# Patient Record
Sex: Male | Born: 1970 | Race: White | Hispanic: No | Marital: Single | Smoking: Never smoker
Health system: Southern US, Community
[De-identification: ages and names within clinical notes are randomized; demographics above are authoritative.]

## PROBLEM LIST (undated history)

## (undated) HISTORY — PX: FOOT SURGERY: SHX648

---

## 2020-05-11 ENCOUNTER — Emergency Department (HOSPITAL_BASED_OUTPATIENT_CLINIC_OR_DEPARTMENT_OTHER)
Admission: EM | Admit: 2020-05-11 | Discharge: 2020-05-11 | Disposition: A | Discharge status: Home / Self Care | Payer: BLUE CROSS/BLUE SHIELD | Attending: Emergency Medicine | Admitting: Emergency Medicine

## 2020-05-11 ENCOUNTER — Encounter (HOSPITAL_BASED_OUTPATIENT_CLINIC_OR_DEPARTMENT_OTHER): Payer: Self-pay | Admitting: Emergency Medicine

## 2020-05-11 ENCOUNTER — Other Ambulatory Visit: Payer: Self-pay

## 2020-05-11 ENCOUNTER — Emergency Department (HOSPITAL_BASED_OUTPATIENT_CLINIC_OR_DEPARTMENT_OTHER): Payer: BLUE CROSS/BLUE SHIELD

## 2020-05-11 DIAGNOSIS — M542 Cervicalgia: Secondary | ICD-10-CM | POA: Insufficient documentation

## 2020-05-11 DIAGNOSIS — M7981 Nontraumatic hematoma of soft tissue: Secondary | ICD-10-CM | POA: Insufficient documentation

## 2020-05-11 DIAGNOSIS — R7401 Elevation of levels of liver transaminase levels: Secondary | ICD-10-CM | POA: Diagnosis not present

## 2020-05-11 DIAGNOSIS — M25512 Pain in left shoulder: Secondary | ICD-10-CM | POA: Insufficient documentation

## 2020-05-11 LAB — CBC
HCT: 45.7 % (ref 39.0–52.0)
Hemoglobin: 15.8 g/dL (ref 13.0–17.0)
MCH: 31.2 pg (ref 26.0–34.0)
MCHC: 34.6 g/dL (ref 30.0–36.0)
MCV: 90.1 fL (ref 80.0–100.0)
Platelets: 273 10*3/uL (ref 150–400)
RBC: 5.07 MIL/uL (ref 4.22–5.81)
RDW: 11.8 % (ref 11.5–15.5)
WBC: 7 10*3/uL (ref 4.0–10.5)
nRBC: 0 % (ref 0.0–0.2)

## 2020-05-11 LAB — BASIC METABOLIC PANEL
Anion gap: 11 (ref 5–15)
BUN: 13 mg/dL (ref 6–20)
CO2: 25 mmol/L (ref 22–32)
Calcium: 9.5 mg/dL (ref 8.9–10.3)
Chloride: 100 mmol/L (ref 98–111)
Creatinine, Ser: 0.94 mg/dL (ref 0.61–1.24)
GFR, Estimated: >60 mL/min (ref >60)
Glucose, Bld: 129 mg/dL — ABNORMAL HIGH (ref 70–99)
Potassium: 3.8 mmol/L (ref 3.5–5.1)
Sodium: 136 mmol/L (ref 135–145)

## 2020-05-11 LAB — HEPATIC FUNCTION PANEL
ALT: 80 U/L — ABNORMAL HIGH (ref 0–44)
AST: 54 U/L — ABNORMAL HIGH (ref 15–41)
Albumin: 4.9 g/dL (ref 3.5–5.0)
Alkaline Phosphatase: 60 U/L (ref 38–126)
Bilirubin, Direct: 0.2 mg/dL (ref 0.0–0.2)
Indirect Bilirubin: 1.4 mg/dL — ABNORMAL HIGH (ref 0.3–0.9)
Total Bilirubin: 1.6 mg/dL — ABNORMAL HIGH (ref 0.3–1.2)
Total Protein: 8.5 g/dL — ABNORMAL HIGH (ref 6.5–8.1)

## 2020-05-11 LAB — PROTIME-INR
INR: 1 (ref 0.8–1.2)
Prothrombin Time: 12.7 seconds (ref 11.4–15.2)

## 2020-05-11 LAB — TROPONIN I (HIGH SENSITIVITY): Troponin I (High Sensitivity): 2 ng/L (ref <18)

## 2020-05-11 MED ORDER — METHOCARBAMOL 500 MG PO TABS: 500.0000 mg | ORAL_TABLET | Freq: Three times a day (TID) | ORAL | 0 refills | Status: AC | PRN

## 2020-05-11 NOTE — ED Triage Notes (Signed)
Pt c/o pain to left shoulder that radiates down arm onset Wednesday. Pt denies injury.

## 2020-05-11 NOTE — Discharge Instructions (Signed)
Follow-up with gastroenterology for your abnormal liver results.  You also should drink less.

## 2020-05-11 NOTE — ED Notes (Signed)
Pt called from lobby stating his pain was radiating into his chest.

## 2020-05-11 NOTE — ED Provider Notes (Signed)
[facility] EMERGENCY DEPARTMENT Provider Note   CSN: [id number] Arrival date & time: 05/11/20  1417     History Chief Complaint  Patient presents with  . Shoulder Pain    Tristan Lee is a 49 y.o. male.  HPI Patient presents with left shoulder and neck pain.  Began earlier today.  States that started a few days ago.  Goes from the neck with some tightness and radiates down the arm.  States it will be tingly at times.  Not exertional.  Sometimes will have slight chest involvement.  No swelling in the arm.  No trauma.  States he is worried because a family member had had a stroke.  Patient does drink somewhat heavily.  States has been bruising more.  Has bruises on his forearms but states this is from his work.  States normally however the bruises would be smaller.  No weight loss.  Does not smoke.  States he gets so anxious when the arm tingling episodes come on.    History reviewed. No pertinent past medical history.  There are no problems to display for this patient.   Past Surgical History:  Procedure Laterality Date  . FOOT SURGERY         No family history on file.  Social History   Tobacco Use  . Smoking status: Never Smoker  . Smokeless tobacco: Never Used  Vaping Use  . Vaping Use: Never used  Substance Use Topics  . Alcohol use: Yes  . Drug use: Not Currently    Home Medications Prior to Admission medications   Medication Sig Start Date End Date Taking? Authorizing Provider  methocarbamol (ROBAXIN) 500 MG tablet Take 1 tablet (500 mg total) by mouth every 8 (eight) hours as needed for muscle spasms. 05/11/20   Benjiman Core, MD    Allergies    Patient has no known allergies.  Review of Systems   Review of Systems  Constitutional: Negative for appetite change and unexpected weight change.  HENT: Negative for congestion.   Respiratory: Negative for shortness of breath.   Cardiovascular: Negative for chest pain.  Gastrointestinal:  Negative for abdominal pain.  Genitourinary: Negative for flank pain.  Musculoskeletal: Positive for neck pain.  Skin: Negative for rash.  Neurological: Negative for weakness and numbness.  Hematological: Bruises/bleeds easily.    Physical Exam Updated Vital Signs BP (!) 153/92 (BP Location: Right Arm)   Pulse 64   Temp 98.6 F (37 C) (Oral)   Resp 16   Ht 5' 10.5" (1.791 m)   Wt 124.7 kg   SpO2 98%   BMI 38.90 kg/m   Physical Exam Vitals and nursing note reviewed.  HENT:     Head: Atraumatic.     Mouth/Throat:     Mouth: Mucous membranes are moist.  Eyes:     Extraocular Movements: Extraocular movements intact.  Cardiovascular:     Rate and Rhythm: Regular rhythm.  Chest:     Chest wall: No tenderness.  Abdominal:     Tenderness: There is no abdominal tenderness.  Musculoskeletal:     Cervical back: Neck supple.     Comments: Mild tenderness on anterior left shoulder.  No deformity.  No crepitance.  Mild muscle tightness over left trapezius.  Bruising over bilateral anterior forearms.  Worse on left.  Skin:    General: Skin is warm.     Capillary Refill: Capillary refill takes less than 2 seconds.  Neurological:     Mental Status: He  is alert and oriented to person, place, and time.     ED Results / Procedures / Treatments   Labs (all labs ordered are listed, but only abnormal results are displayed) Labs Reviewed  BASIC METABOLIC PANEL - Abnormal; Notable for the following components:      Result Value   Glucose, Bld 129 (*)    All other components within normal limits  HEPATIC FUNCTION PANEL - Abnormal; Notable for the following components:   Total Protein 8.5 (*)    AST 54 (*)    ALT 80 (*)    Total Bilirubin 1.6 (*)    Indirect Bilirubin 1.4 (*)    All other components within normal limits  CBC  PROTIME-INR  TROPONIN I (HIGH SENSITIVITY)    EKG EKG Interpretation  Date/Time:  Sunday May 11 2020 14:34:03 EST Ventricular Rate:  75 PR  Interval:  134 QRS Duration: 94 QT Interval:  388 QTC Calculation: 433 R Axis:   48 Text Interpretation: Normal sinus rhythm Normal ECG Confirmed by Kyen Taite ([id number]) on 05/11/2020 5:26:02 PM   Radiology DG Shoulder Left  Result Date: 05/11/2020 CLINICAL DATA:  Atraumatic left shoulder pain x2 days. EXAM: LEFT SHOULDER - 2+ VIEW COMPARISON:  None. FINDINGS: There is no evidence of fracture or dislocation. There is no evidence of arthropathy or other focal bone abnormality. Soft tissues are unremarkable. IMPRESSION: Negative. Electronically Signed   By: Thaddeus  Houston M.D.   On: 05/11/2020 17:57    Procedures Procedures (including critical care time)  Medications Ordered in ED Medications - No data to display  ED Course  I have reviewed the triage vital signs and the nursing notes.  Pertinent labs & imaging results that were available during my care of the patient were reviewed by me and considered in my medical decision making (see chart for details).    MDM Rules/Calculators/A&P                         Patient presents with left shoulder pain.  Tingling.  I think most likely musculoskeletal.  Some tenderness over musculature.  Doubt arterial disease.  Has strong radial pulse.  Doubt stroke.  Doubt cardiac cause.  However does have increased bruising.  Does drink somewhat heavily.  States he will drink 12 pack of beer around 3 times a week.  LFTs mildly abnormal.  INR normal.  I think patient would benefit from outpatient follow-up with gastroenterology for further evaluation.  Could be second to his alcohol use however.  Epidural hematoma or other such causes of the shoulder pain felt less likely.  Discharge home. Final Clinical Impression(s) / ED Diagnoses Final diagnoses:  Acute pain of left shoulder  Transaminitis    Rx / DC Orders ED Discharge Orders         Ordered    methocarbamol (ROBAXIN) 500 MG tablet  Every 8 hours PRN        11 /28/21 1852             02-02-1987, MD 05/11/20 [phone]

## 2022-02-22 IMAGING — CR DG SHOULDER 2+V*L*
3 series · 3 of 3 positions shown · non-contrast
Comparison: None.

CLINICAL DATA: Atraumatic left shoulder pain x2 days.

EXAM:
LEFT SHOULDER - 2+ VIEW

[w shoulder grashey left]
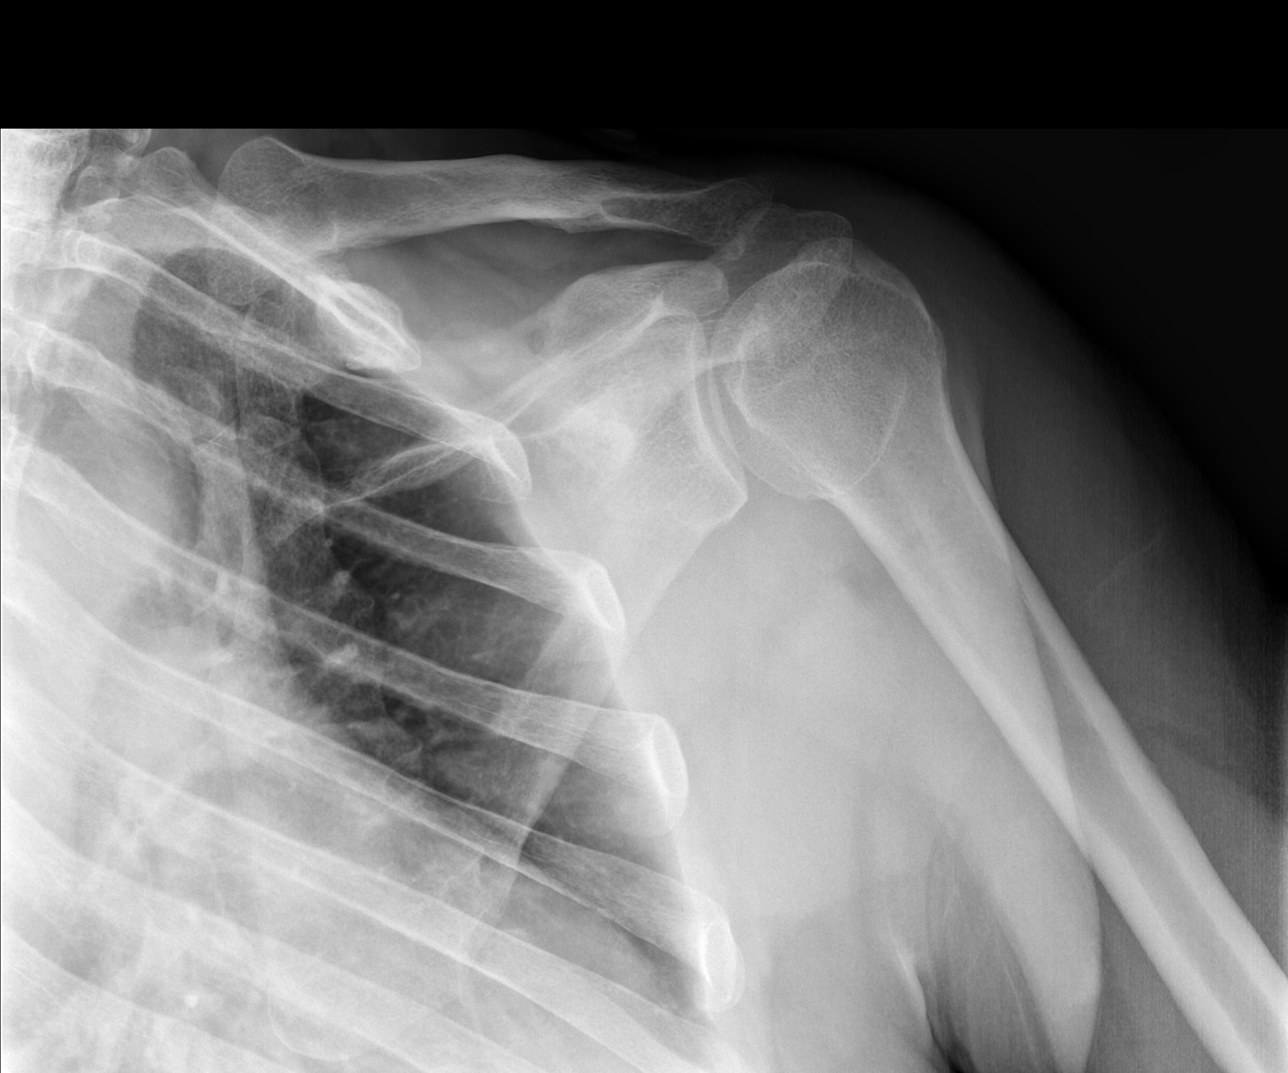

[w shoulder y view left]
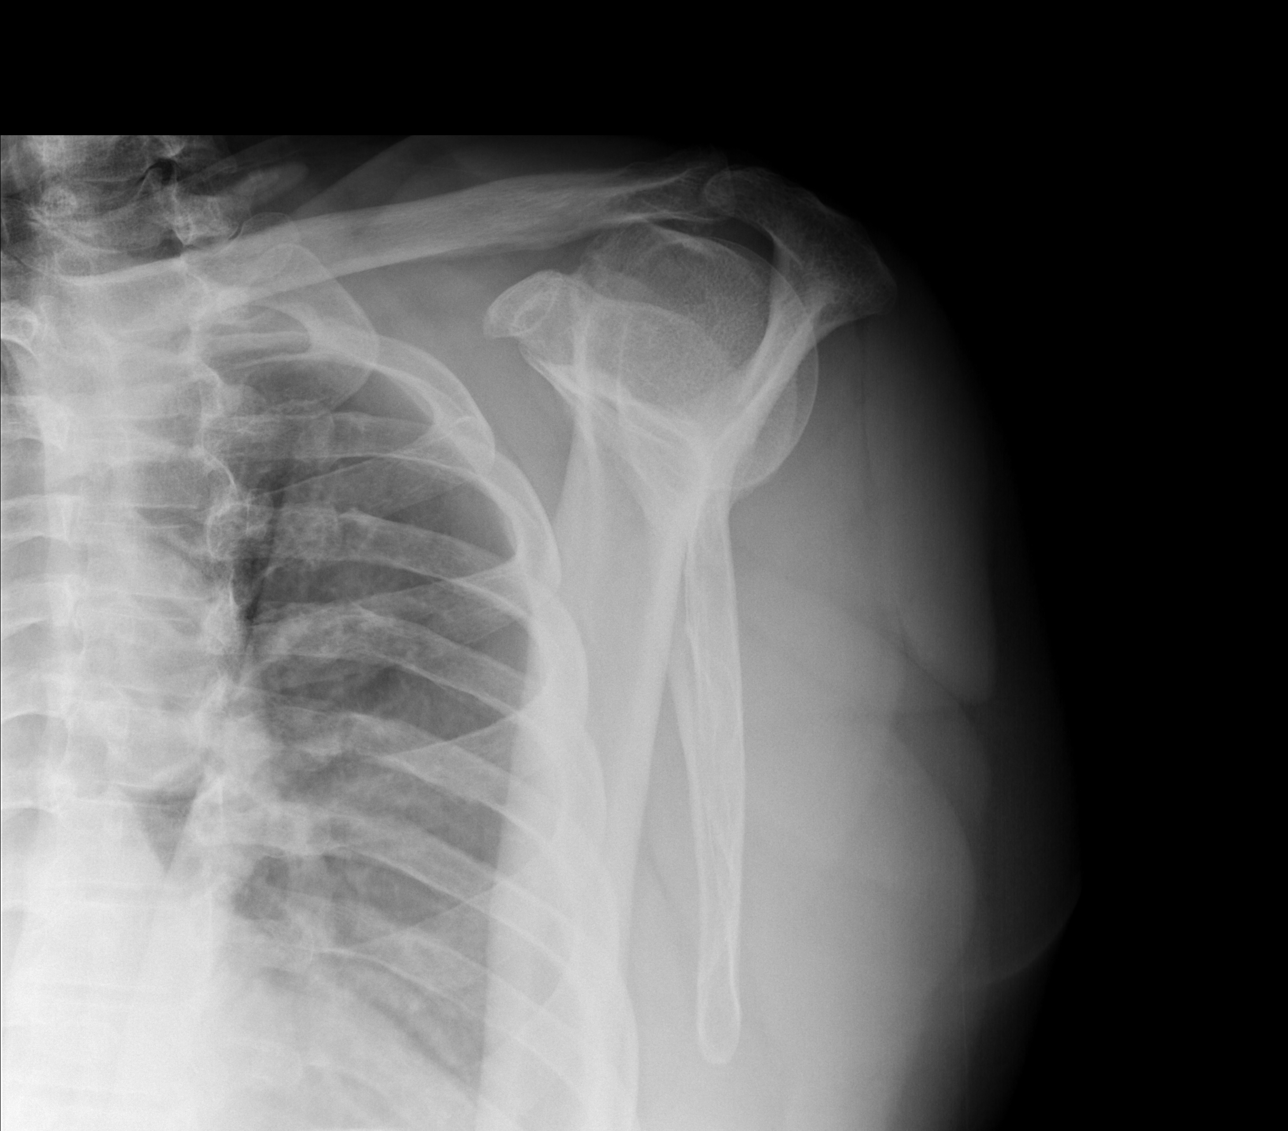

[w shoulder ap internal left]
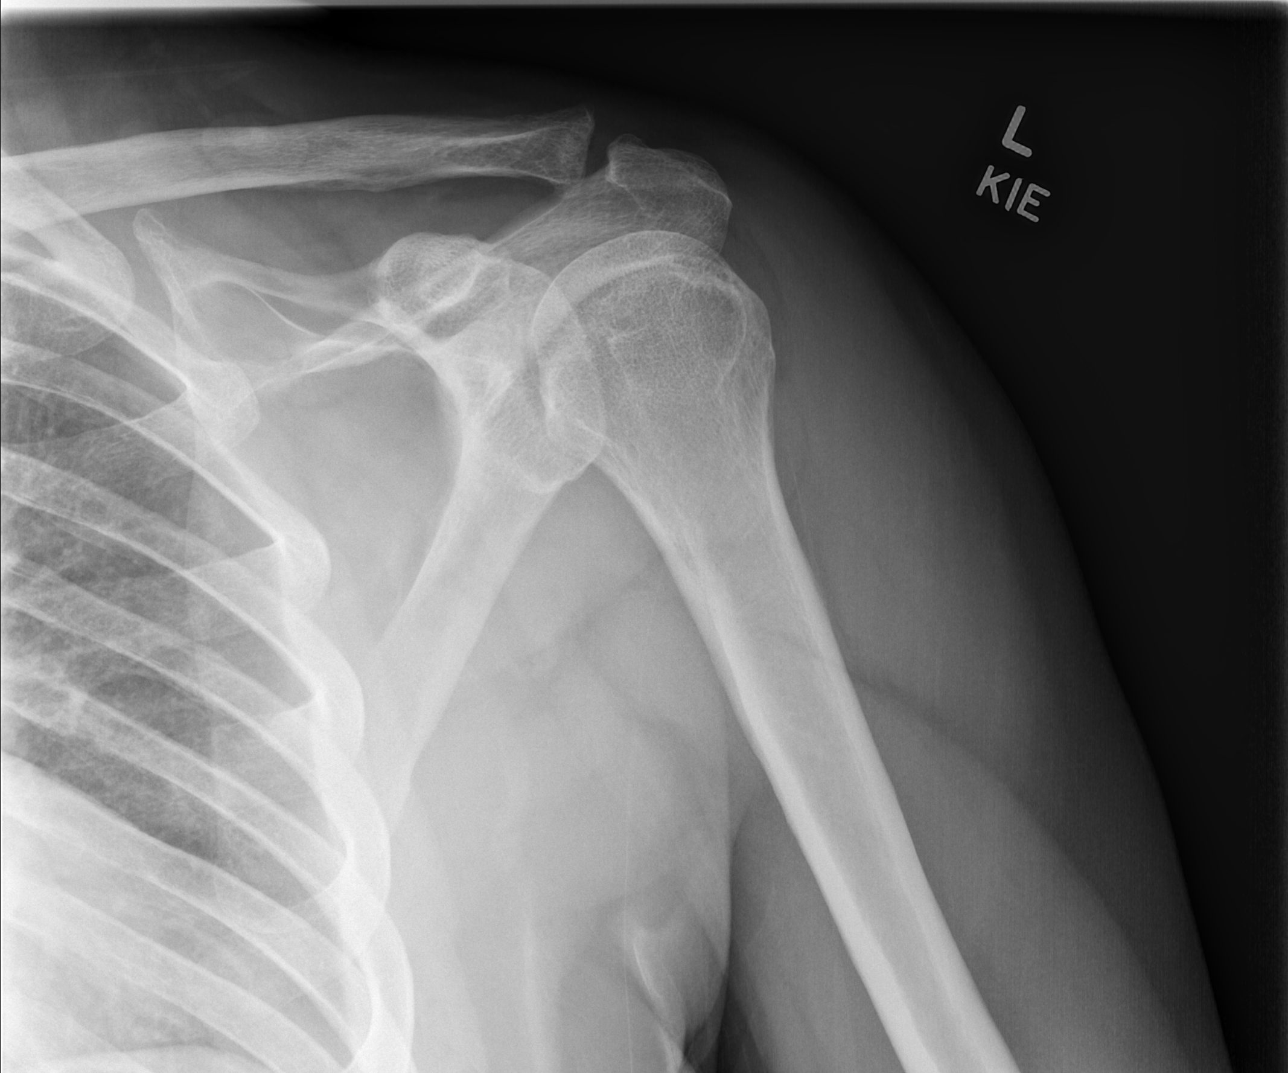

[3 of 3 positions shown; findings below may reference images not displayed]

FINDINGS: There is no evidence of fracture or dislocation. There is no
evidence of arthropathy or other focal bone abnormality. Soft
tissues are unremarkable.
IMPRESSION: Negative.

## 2024-01-13 DEATH — deceased
